# Patient Record
Sex: Male | Born: 1985 | Race: White | Hispanic: No | Smoking: Current every day smoker
Health system: Southern US, Community
[De-identification: ages and names within clinical notes are randomized; demographics above are authoritative.]

## PROBLEM LIST (undated history)

## (undated) DIAGNOSIS — G40409 Other generalized epilepsy and epileptic syndromes, not intractable, without status epilepticus: Secondary | ICD-10-CM

## (undated) DIAGNOSIS — R569 Unspecified convulsions: Secondary | ICD-10-CM

## (undated) DIAGNOSIS — J302 Other seasonal allergic rhinitis: Secondary | ICD-10-CM

## (undated) DIAGNOSIS — Z9889 Other specified postprocedural states: Secondary | ICD-10-CM

## (undated) HISTORY — PX: LAMINECTOMY: SHX219

## (undated) HISTORY — PX: SHOULDER SURGERY: SHX246

## (undated) HISTORY — PX: KNEE SURGERY: SHX244

## (undated) HISTORY — PX: ULNAR TUNNEL RELEASE: SHX820

---

## 2005-07-09 ENCOUNTER — Emergency Department: Payer: Self-pay | Admitting: Emergency Medicine

## 2007-07-12 ENCOUNTER — Ambulatory Visit: Payer: Self-pay | Admitting: Family Medicine

## 2007-12-27 ENCOUNTER — Other Ambulatory Visit: Payer: Self-pay

## 2007-12-28 ENCOUNTER — Inpatient Hospital Stay: Payer: Self-pay | Admitting: Internal Medicine

## 2008-06-19 ENCOUNTER — Inpatient Hospital Stay: Payer: Self-pay | Admitting: Psychiatry

## 2009-11-25 ENCOUNTER — Emergency Department: Payer: Self-pay | Admitting: Emergency Medicine

## 2010-01-04 ENCOUNTER — Inpatient Hospital Stay: Payer: Self-pay | Admitting: Psychiatry

## 2010-12-14 ENCOUNTER — Emergency Department: Payer: Self-pay | Admitting: *Deleted

## 2011-02-17 ENCOUNTER — Emergency Department: Payer: Self-pay | Admitting: Unknown Physician Specialty

## 2011-03-04 ENCOUNTER — Ambulatory Visit: Payer: Self-pay | Admitting: Family Medicine

## 2011-05-12 ENCOUNTER — Emergency Department: Payer: Self-pay | Admitting: Emergency Medicine

## 2012-04-13 IMAGING — CT CT HEAD WITHOUT AND WITH CONTRAST
1 of 2 series · 13 of 30 positions shown, 17 images · non-contrast
Comparison: none

REASON FOR EXAM: headache visual changes vertigo
COMMENTS:

[Series 2: soft tissue wo · axial · 0.42mm/px · z∈[+697,+817]mm · 13 of 30 slices shown, 17 images]
[im 3/30  brain]
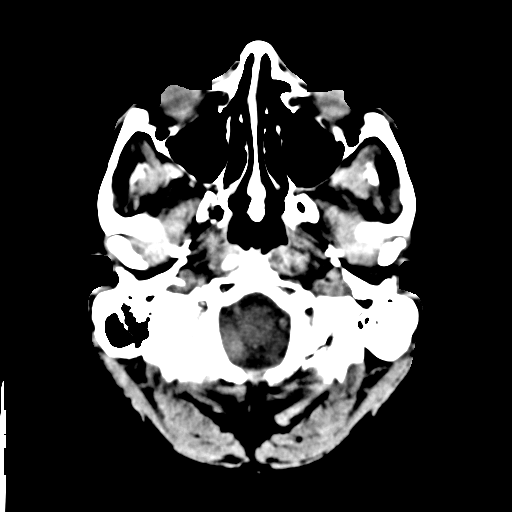
[im 3/30  bone]
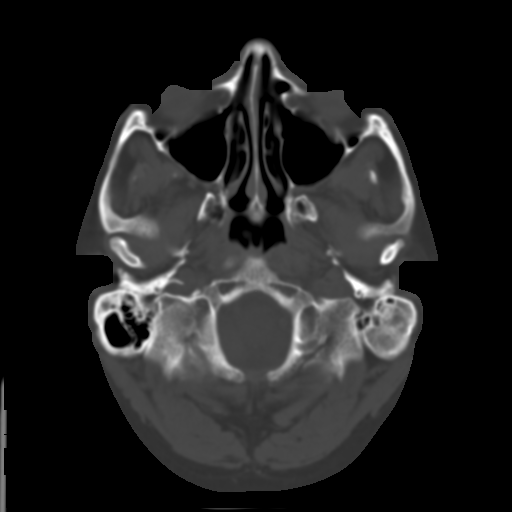
[im 5/30  brain]
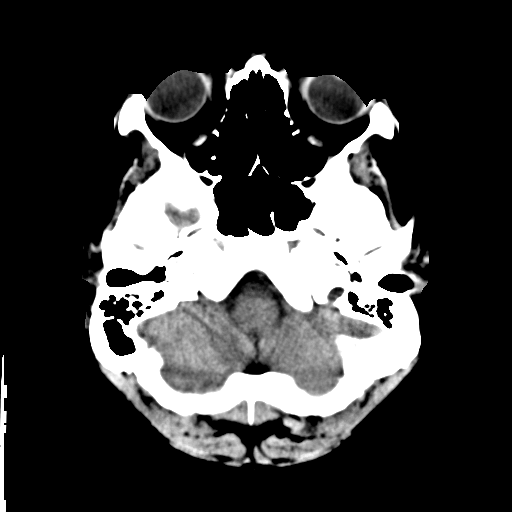
[im 7/30  brain]
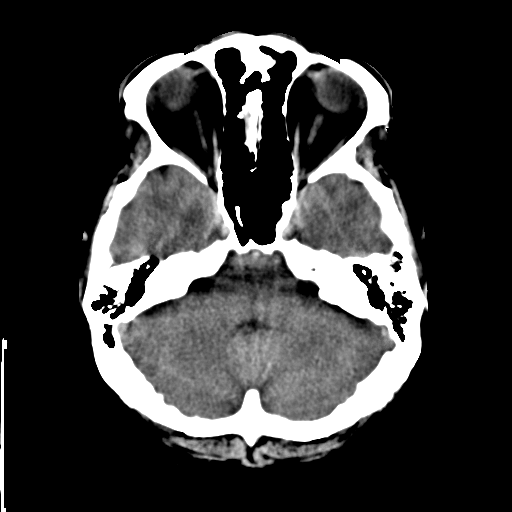
[im 9/30  brain]
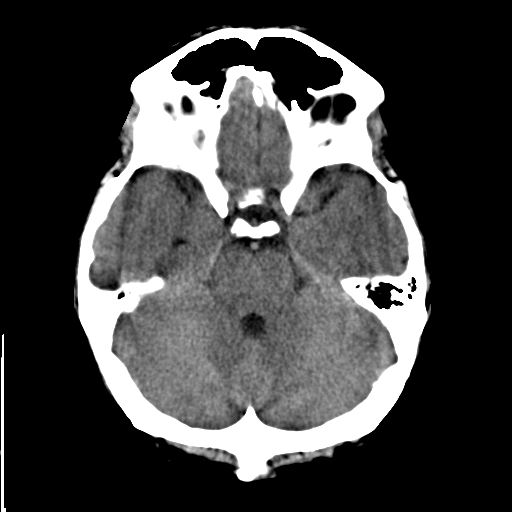
[im 11/30  brain]
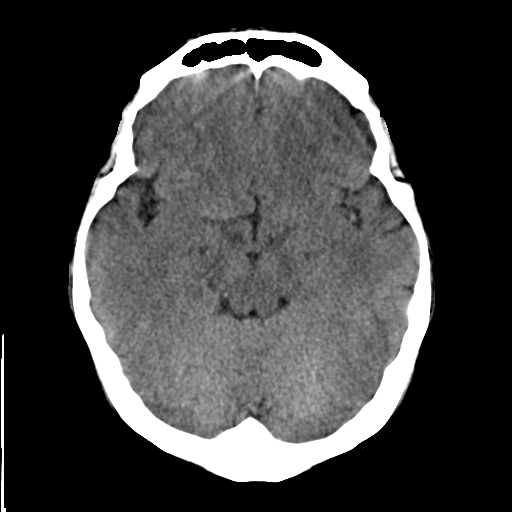
[im 11/30  bone]
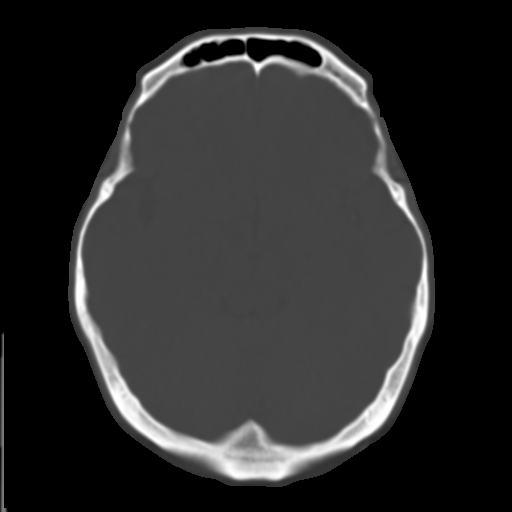
[im 13/30  brain]
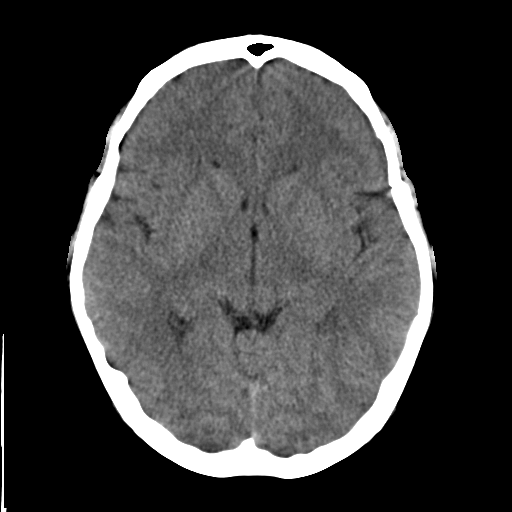
[im 15/30  brain]
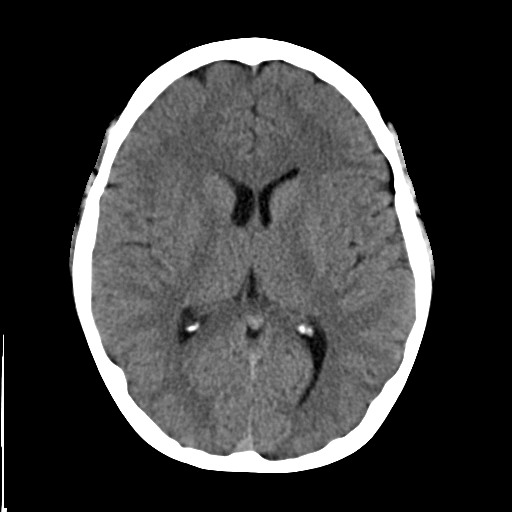
[im 17/30  brain]
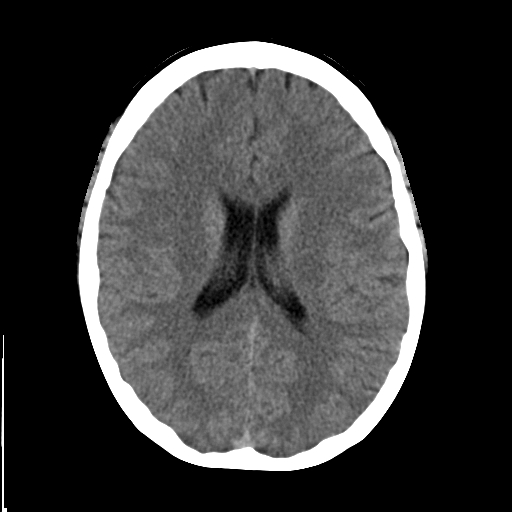
[im 19/30  brain]
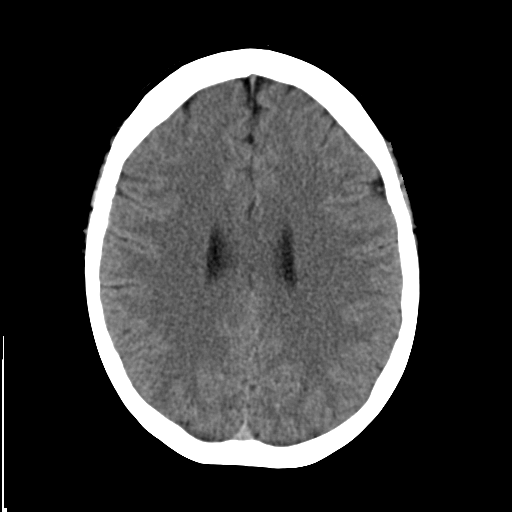
[im 19/30  bone]
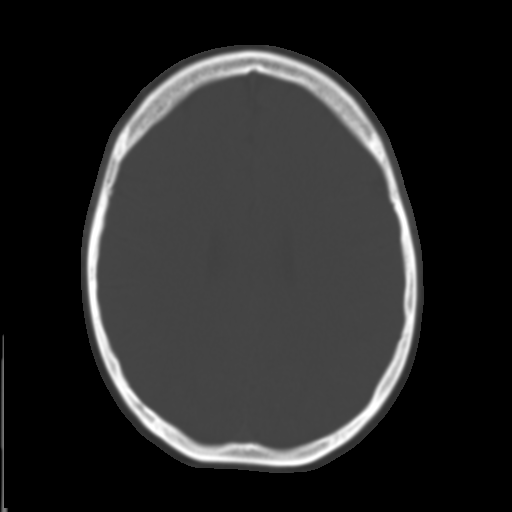
[im 21/30  brain]
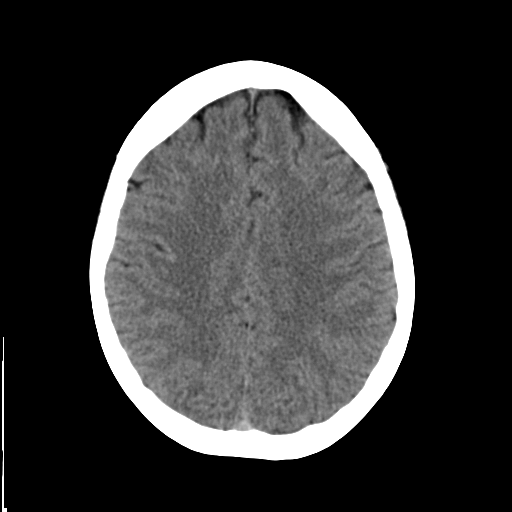
[im 23/30  brain]
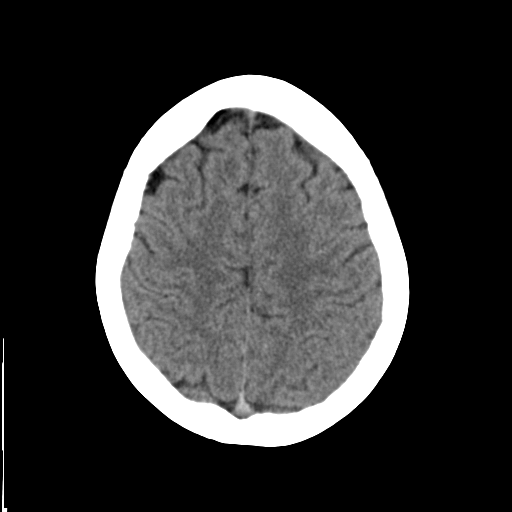
[im 25/30  brain]
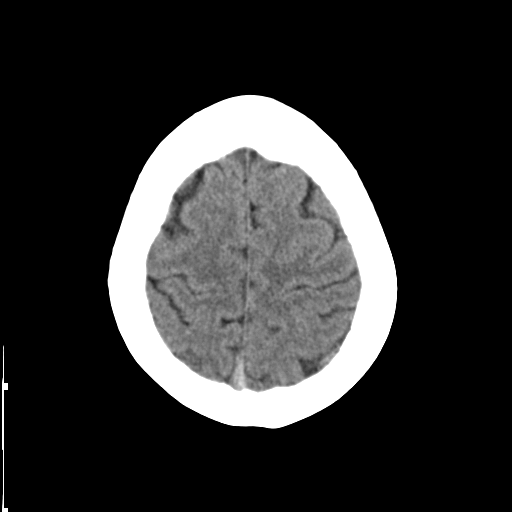
[im 27/30  brain]
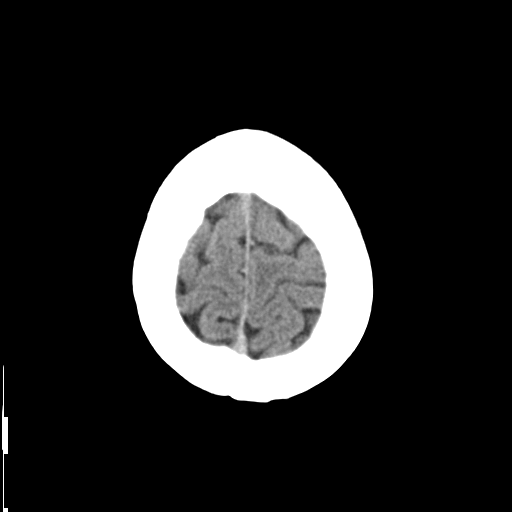
[im 27/30  bone]
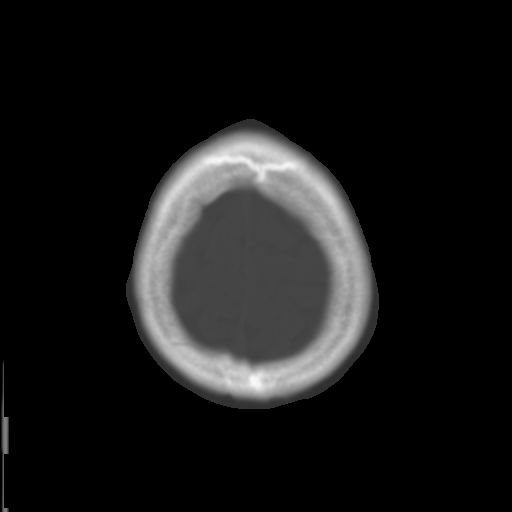

[13 of 30 positions shown; findings below may reference images not displayed]

PROCEDURE:     HOHN - RI MA GODCHA/QUEENA  - March 04, 2011 [DATE]

RESULT:     CT of the brain prior to and following intravenous
administration of 50 mL of Isovue 300 iodinated intravenous contrast is
performed and compared to the previous noncontrast exam dated 12/27/2007.

The precontrast images demonstrate the ventricles and sulci appear to be
within normal limits. There is no evidence of intracranial hemorrhage,
territorial infarct, mass, mass effect or midline shift. No hydrocephalus is
evident.

Following contrast administration there is no evidence of abnormal
enhancement to suggest a vascular malformation or discrete mass. MRI
follow-up should be considered. The sinuses and mastoids show normal
aeration. The calvarium shows no bony destruction or sclerosis and no
fracture.
IMPRESSION: No focal intracranial abnormality evident. Given the
neurologic symptoms, consideration for MRI should be given.

## 2013-06-28 ENCOUNTER — Emergency Department: Payer: Self-pay | Admitting: Emergency Medicine

## 2013-09-09 ENCOUNTER — Emergency Department: Payer: Self-pay | Admitting: Emergency Medicine

## 2013-09-26 ENCOUNTER — Emergency Department: Payer: Self-pay | Admitting: Emergency Medicine

## 2015-02-23 ENCOUNTER — Encounter: Payer: Self-pay | Admitting: Emergency Medicine

## 2015-02-23 ENCOUNTER — Ambulatory Visit
Admission: EM | Admit: 2015-02-23 | Discharge: 2015-02-23 | Disposition: A | Discharge status: Home / Self Care | Payer: Self-pay | Attending: Family Medicine | Admitting: Family Medicine

## 2015-02-23 DIAGNOSIS — J0110 Acute frontal sinusitis, unspecified: Secondary | ICD-10-CM

## 2015-02-23 DIAGNOSIS — J0100 Acute maxillary sinusitis, unspecified: Secondary | ICD-10-CM

## 2015-02-23 HISTORY — DX: Other seasonal allergic rhinitis: J30.2

## 2015-02-23 HISTORY — DX: Other generalized epilepsy and epileptic syndromes, not intractable, without status epilepticus: G40.409

## 2015-02-23 HISTORY — DX: Other specified postprocedural states: Z98.890

## 2015-02-23 HISTORY — DX: Unspecified convulsions: R56.9

## 2015-02-23 MED ORDER — BENZONATATE 100 MG PO CAPS: 100.0000 mg | ORAL_CAPSULE | Freq: Three times a day (TID) | ORAL | Status: DC | PRN

## 2015-02-23 MED ORDER — AMOXICILLIN-POT CLAVULANATE 875-125 MG PO TABS: 1.0000 | ORAL_TABLET | Freq: Two times a day (BID) | ORAL | Status: AC

## 2015-02-23 NOTE — ED Provider Notes (Signed)
[facility] Emergency Department Alieyah Spader Note  ____________________________________________  Time seen: Approximately 2:30 PM  I have reviewed the triage vital signs and the nursing notes.   HISTORY  Chief Complaint Facial Pain  HPI Geoffrey Hill is a 29 y.o. male presents for complaints of runny nose, congestion, sinus pressure and intermittent cough x 10 days. Reports started with mild runny nose and congestion and he thought it was just allergies but has worsened with sinus pressure and then continued. Denies fever. Reports continues to eat and drink well. Reports post nasal drainage at night which causes cough.   Denies chest pain, shortness of breath, nausea, vomiting, diarrhea, wheezing or other complaints.    Past Medical History  Diagnosis Date  . Seasonal allergies   . Seizures   . H/O discectomy   . Tonic clonic seizures     There are no active problems to display for this patient.   Past Surgical History  Procedure Laterality Date  . Laminectomy    . Ulnar tunnel release    . Shoulder surgery    . Knee surgery      Current Outpatient Rx  Name  Route  Sig  Dispense  Refill  . lamoTRIgine (LAMICTAL) 100 MG tablet   Oral   Take 100 mg by mouth daily. Taking 125 mg twice a day         . levETIRAcetam (KEPPRA) 750 MG tablet   Oral   Take 750 mg by mouth 2 (two) times daily.         Marland Kitchen losartan-hydrochlorothiazide (HYZAAR) 100-25 MG per tablet   Oral   Take 1 tablet by mouth daily.           Allergies Daypro  History reviewed. No pertinent family history.  Social History Social History  Substance Use Topics  . Smoking status: Current Every Day Smoker    Types: Cigarettes  . Smokeless tobacco: Never Used  . Alcohol Use: No    Review of Systems Constitutional: No fever/chills Eyes: No visual changes. ENT: positive for runny nose, congestion, sore throat and sinus pressure.  Cardiovascular: Denies chest  pain. Respiratory: Denies shortness of breath.positive for intermittent cough.  Gastrointestinal: No abdominal pain.  No nausea, no vomiting.  No diarrhea.  No constipation. Genitourinary: Negative for dysuria. Musculoskeletal: Negative for back pain. Skin: Negative for rash. Neurological: Negative for headaches, focal weakness or numbness.  10-point ROS otherwise negative.  ____________________________________________   PHYSICAL EXAM:  VITAL SIGNS: ED Triage Vitals  Enc Vitals Group     BP 02/23/15 1411 103/72 mmHg     Pulse Rate 02/23/15 1411 89     Resp 02/23/15 1411 18     Temp 02/23/15 1411 98.2 F (36.8 C)     Temp Source 02/23/15 1411 Tympanic     SpO2 02/23/15 1411 100 %     Weight 02/23/15 1411 230 lb (104.327 kg)     Height 02/23/15 1411  (1.905 m)     Head Cir --      Peak Flow --      Pain Score 02/23/15 1419 5     Pain Loc --      Pain Edu? --      Excl. in GC? --     Constitutional: Alert and oriented. Well appearing and in no acute distress. Eyes: Conjunctivae are normal. PERRL. EOMI. Head: Atraumatic.mod TTP maxillary sinuses, mild TTP frontal sinuses.   Ears: no erythema, bilateral dullness.  Nose: bilateral nares with purulent rhinorrhea with bilateral turbinate edema, nares patent.   Mouth/Throat: Mucous membranes are moist.  Oropharynx non-erythematous. Neck: No stridor.  No cervical spine tenderness to palpation. Hematological/Lymphatic/Immunilogical: No cervical lymphadenopathy. Cardiovascular: Normal rate, regular rhythm. Grossly normal heart sounds.  Good peripheral circulation. Respiratory: Normal respiratory effort.  No retractions. Lungs CTAB.Mild intermittent dry cough.  Gastrointestinal: Soft and nontender. No distention. Normal Bowel sounds.  Musculoskeletal: No lower or upper extremity tenderness nor edema.  No joint effusions. Bilateral pedal pulses equal and easily palpated.  Neurologic:  Normal speech and language. No gross focal  neurologic deficits are appreciated. No gait instability. Skin:  Skin is warm, dry and intact. No rash noted. Psychiatric: Mood and affect are normal. Speech and behavior are normal.   ____________________________________________   LABS (all labs ordered are listed, but only abnormal results are displayed)  Labs Reviewed - No data to display   INITIAL IMPRESSION / ASSESSMENT AND PLAN / ED COURSE  Pertinent labs & imaging results that were available during my care of the patient were reviewed by me and considered in my medical decision making (see chart for details).  Very well appearing. No acute distress. Presents for runny nose, congestion, sinus pressure x 10 days with intermittent cough. Will treat maxillary and frontal sinusitis with oral augmentin, prn ibuprofen, rest, fluids, supportive care, and prn tessalon perles. Discussed follow up with Primary care physician this week. Discussed follow up and return parameters including no resolution or any worsening concerns. Patient verbalized understanding and agreed to plan.   ____________________________________________   FINAL CLINICAL IMPRESSION(S) / ED DIAGNOSES  Final diagnoses:  Acute maxillary sinusitis, recurrence not specified  Acute frontal sinusitis, recurrence not specified       Renford Dills, NP 02/23/15 1456

## 2015-02-23 NOTE — Discharge Instructions (Signed)
Take medication as prescribed. Rest. Drink plenty of fluids.   Follow up with your primary care physician this week as needed. Return to Urgent care for new or worsening concerns.  Sinusitis Sinusitis is redness, soreness, and inflammation of the paranasal sinuses. Paranasal sinuses are air pockets within the bones of your face (beneath the eyes, the middle of the forehead, or above the eyes). In healthy paranasal sinuses, mucus is able to drain out, and air is able to circulate through them by way of your nose. However, when your paranasal sinuses are inflamed, mucus and air can become trapped. This can allow bacteria and other germs to grow and cause infection. Sinusitis can develop quickly and last only a short time (acute) or continue over a long period (chronic). Sinusitis that lasts for more than 12 weeks is considered chronic.  CAUSES  Causes of sinusitis include:  Allergies.  Structural abnormalities, such as displacement of the cartilage that separates your nostrils (deviated septum), which can decrease the air flow through your nose and sinuses and affect sinus drainage.  Functional abnormalities, such as when the small hairs (cilia) that line your sinuses and help remove mucus do not work properly or are not present. SIGNS AND SYMPTOMS  Symptoms of acute and chronic sinusitis are the same. The primary symptoms are pain and pressure around the affected sinuses. Other symptoms include:  Upper toothache.  Earache.  Headache.  Bad breath.  Decreased sense of smell and taste.  A cough, which worsens when you are lying flat.  Fatigue.  Fever.  Thick drainage from your nose, which often is green and may contain pus (purulent).  Swelling and warmth over the affected sinuses. DIAGNOSIS  Your health care provider will perform a physical exam. During the exam, your health care provider may:  Look in your nose for signs of abnormal growths in your nostrils (nasal  polyps).  Tap over the affected sinus to check for signs of infection.  View the inside of your sinuses (endoscopy) using an imaging device that has a light attached (endoscope). If your health care provider suspects that you have chronic sinusitis, one or more of the following tests may be recommended:  Allergy tests.  Nasal culture. A sample of mucus is taken from your nose, sent to a lab, and screened for bacteria.  Nasal cytology. A sample of mucus is taken from your nose and examined by your health care provider to determine if your sinusitis is related to an allergy. TREATMENT  Most cases of acute sinusitis are related to a viral infection and will resolve on their own within 10 days. Sometimes medicines are prescribed to help relieve symptoms (pain medicine, decongestants, nasal steroid sprays, or saline sprays).  However, for sinusitis related to a bacterial infection, your health care provider will prescribe antibiotic medicines. These are medicines that will help kill the bacteria causing the infection.  Rarely, sinusitis is caused by a fungal infection. In theses cases, your health care provider will prescribe antifungal medicine. For some cases of chronic sinusitis, surgery is needed. Generally, these are cases in which sinusitis recurs more than 3 times per year, despite other treatments. HOME CARE INSTRUCTIONS   Drink plenty of water. Water helps thin the mucus so your sinuses can drain more easily.  Use a humidifier.  Inhale steam 3 to 4 times a day (for example, sit in the bathroom with the shower running).  Apply a warm, moist washcloth to your face 3 to 4 times a day,  or as directed by your health care provider.  Use saline nasal sprays to help moisten and clean your sinuses.  Take medicines only as directed by your health care provider.  If you were prescribed either an antibiotic or antifungal medicine, finish it all even if you start to feel better. SEEK IMMEDIATE  MEDICAL CARE IF:  You have increasing pain or severe headaches.  You have nausea, vomiting, or drowsiness.  You have swelling around your face.  You have vision problems.  You have a stiff neck.  You have difficulty breathing. MAKE SURE YOU:   Understand these instructions.  Will watch your condition.  Will get help right away if you are not doing well or get worse. Document Released: 05/18/2005 Document Revised: 10/02/2013 Document Reviewed: 06/02/2011 [facility] Patient Information 2015 [place], [place]. This information is not intended to replace advice given to you by your health care provider. Make sure you discuss any questions you have with your health care provider.

## 2015-02-23 NOTE — ED Notes (Signed)
Sinus pressure, pain, cough, congestion, headache for 10 days

## 2015-03-24 ENCOUNTER — Ambulatory Visit
Admission: EM | Admit: 2015-03-24 | Discharge: 2015-03-24 | Disposition: A | Discharge status: Home / Self Care | Payer: Self-pay | Attending: Family Medicine | Admitting: Family Medicine

## 2015-03-24 DIAGNOSIS — R1032 Left lower quadrant pain: Secondary | ICD-10-CM

## 2015-03-24 LAB — COMPREHENSIVE METABOLIC PANEL
ALT: 15 U/L — ABNORMAL LOW (ref 17–63)
AST: 21 U/L (ref 15–41)
Albumin: 4.5 g/dL (ref 3.5–5.0)
Alkaline Phosphatase: 66 U/L (ref 38–126)
Anion gap: 8 (ref 5–15)
BILIRUBIN TOTAL: 0.3 mg/dL (ref 0.3–1.2)
BUN: 11 mg/dL (ref 6–20)
CO2: 28 mmol/L (ref 22–32)
Calcium: 9.2 mg/dL (ref 8.9–10.3)
Chloride: 105 mmol/L (ref 101–111)
Creatinine, Ser: 0.79 mg/dL (ref 0.61–1.24)
GFR calc Af Amer: >60 mL/min (ref >60)
Glucose, Bld: 159 mg/dL — ABNORMAL HIGH (ref 65–99)
POTASSIUM: 4.2 mmol/L (ref 3.5–5.1)
Sodium: 141 mmol/L (ref 135–145)
TOTAL PROTEIN: 7.1 g/dL (ref 6.5–8.1)

## 2015-03-24 LAB — CBC WITH DIFFERENTIAL/PLATELET
BASOS ABS: 0.1 10*3/uL (ref 0–0.1)
Basophils Relative: 1 %
EOS PCT: 3 %
Eosinophils Absolute: 0.2 10*3/uL (ref 0–0.7)
HCT: 48 % (ref 40.0–52.0)
Hemoglobin: 16 g/dL (ref 13.0–18.0)
LYMPHS PCT: 31 %
Lymphs Abs: 3.1 10*3/uL (ref 1.0–3.6)
MCH: 28.1 pg (ref 26.0–34.0)
MCHC: 33.3 g/dL (ref 32.0–36.0)
MCV: 84.4 fL (ref 80.0–100.0)
Monocytes Absolute: 0.6 10*3/uL (ref 0.2–1.0)
Monocytes Relative: 6 %
NEUTROS ABS: 5.8 10*3/uL (ref 1.4–6.5)
Neutrophils Relative %: 59 %
PLATELETS: 209 10*3/uL (ref 150–440)
RBC: 5.69 MIL/uL (ref 4.40–5.90)
RDW: 14.7 % — ABNORMAL HIGH (ref 11.5–14.5)
WBC: 9.9 10*3/uL (ref 3.8–10.6)

## 2015-03-24 NOTE — ED Notes (Signed)
States started 3 days ago with LUQ abdominal pain. Denies N/V. States normal BM today and when wipes noted bright red blood. Color good, skin warm and dry to touch

## 2015-03-24 NOTE — ED Provider Notes (Signed)
Patient presents today with symptoms of left sided abdominal pain. Patient states that he has had symptoms for the last few days. He denies any vomiting but does have some nausea. He has noticed some bloody diarrhea as well. He denies any external hemorrhoids. Does have family history of diverticulitis. His blood pressure is elevated in the office today however admits to not taking his blood pressure medication this morning. He denies any chest pain, shortness of breath, fever, severe headache.  ROS: Negative except mentioned above. Vitals as per Epic.  GENERAL: NAD HEENT: no pharyngeal erythema, no exudate RESP: CTA B CARD: RRR ABD: +BS, soft, tenderness along left lower and upper abdomen, no guarding or rebound, no flank tenderness   A/P: Left sided abdominal pain with bloody diarrhea- CBC w/diff and CMP were done, recommend patient go to the ER for further evaluation with CT, etc. family member here to take patient directly to the ER at this time. Patient is stable and able to walk out of the urgent care on his own.  Jolene ProvostKirtida Rommel [place], MD 03/24/15 [phone]

## 2015-11-22 ENCOUNTER — Encounter: Payer: Self-pay | Admitting: *Deleted

## 2015-11-22 ENCOUNTER — Ambulatory Visit (INDEPENDENT_AMBULATORY_CARE_PROVIDER_SITE_OTHER): Payer: Self-pay

## 2015-11-22 ENCOUNTER — Ambulatory Visit
Admission: EM | Admit: 2015-11-22 | Discharge: 2015-11-22 | Disposition: A | Discharge status: Home / Self Care | Payer: Self-pay | Attending: Family Medicine | Admitting: Family Medicine

## 2015-11-22 DIAGNOSIS — J40 Bronchitis, not specified as acute or chronic: Secondary | ICD-10-CM

## 2015-11-22 DIAGNOSIS — J069 Acute upper respiratory infection, unspecified: Secondary | ICD-10-CM

## 2015-11-22 MED ORDER — ALBUTEROL SULFATE HFA 108 (90 BASE) MCG/ACT IN AERS: 2.0000 | INHALATION_SPRAY | RESPIRATORY_TRACT | Status: AC | PRN

## 2015-11-22 MED ORDER — BENZONATATE 100 MG PO CAPS: 100.0000 mg | ORAL_CAPSULE | Freq: Three times a day (TID) | ORAL | Status: AC | PRN

## 2015-11-22 MED ORDER — PREDNISONE 20 MG PO TABS: 40.0000 mg | ORAL_TABLET | Freq: Every day | ORAL | Status: AC

## 2015-11-22 MED ORDER — IPRATROPIUM-ALBUTEROL 0.5-2.5 (3) MG/3ML IN SOLN
3.0000 mL | Freq: Four times a day (QID) | RESPIRATORY_TRACT | Status: DC
  Administered 2015-11-22: 3 mL via RESPIRATORY_TRACT

## 2015-11-22 NOTE — Discharge Instructions (Signed)
Take medication as prescribed. Rest. Drink plenty of fluids.   Follow up with your primary care physician this week as scheduled.   Return to Urgent care or ER for new or worsening concerns.    Upper Respiratory Infection, Adult Most upper respiratory infections (URIs) are caused by a virus. A URI affects the nose, throat, and upper air passages. The most common type of URI is often called "the common cold." HOME CARE  1. Take medicines only as told by your doctor. 2. Gargle warm saltwater or take cough drops to comfort your throat as told by your doctor. 3. Use a warm mist humidifier or inhale steam from a shower to increase air moisture. This may make it easier to breathe. 4. Drink enough fluid to keep your pee (urine) clear or pale yellow. 5. Eat soups and other clear broths. 6. Have a healthy diet. 7. Rest as needed. 8. Go back to work when your fever is gone or your doctor says it is okay. 1. You may need to stay home longer to avoid giving your URI to others. 2. You can also wear a face mask and wash your hands often to prevent spread of the virus. 9. Use your inhaler more if you have asthma. 10. Do not use any tobacco products, including cigarettes, chewing tobacco, or electronic cigarettes. If you need help quitting, ask your doctor. GET HELP IF:  You are getting worse, not better.  Your symptoms are not helped by medicine.  You have chills.  You are getting more short of breath.  You have brown or red mucus.  You have yellow or brown discharge from your nose.  You have pain in your face, especially when you bend forward.  You have a fever.  You have puffy (swollen) neck glands.  You have pain while swallowing.  You have white areas in the back of your throat. GET HELP RIGHT AWAY IF:   You have very bad or constant:  Headache.  Ear pain.  Pain in your forehead, behind your eyes, and over your cheekbones (sinus pain).  Chest pain.  You have long-lasting  (chronic) lung disease and any of the following:  Wheezing.  Long-lasting cough.  Coughing up blood.  A change in your usual mucus.  You have a stiff neck.  You have changes in your:  Vision.  Hearing.  Thinking.  Mood. MAKE SURE YOU:   Understand these instructions.  Will watch your condition.  Will get help right away if you are not doing well or get worse.   This information is not intended to replace advice given to you by your health care provider. Make sure you discuss any questions you have with your health care provider.   Document Released: 11/04/2007 Document Revised: 10/02/2014 Document Reviewed: 08/23/2013 Elsevier Interactive Patient Education 2016 [employer]  Metered Dose Inhaler (No Spacer Used) Inhaled medicines are the basis of treatment for asthma and other breathing problems. Inhaled medicine can only be effective if used properly. Good technique assures that the medicine reaches the lungs. Metered dose inhalers (MDIs) are used to deliver a variety of inhaled medicines. These include quick relief or rescue medicines (such as bronchodilators) and controller medicines (such as corticosteroids). The medicine is delivered by pushing down on a metal canister to release a set amount of spray. If you are using different kinds of inhalers, use your quick relief medicine to open the airways 10-15 minutes before using a steroid, if instructed to do so by  your health care provider. If you are unsure which inhalers to use and the order of using them, ask your health care provider, nurse, or respiratory therapist. HOW TO USE THE INHALER 11. Remove the cap from the inhaler. 12. If you are using the inhaler for the first time, you will need to prime it. Shake the inhaler for 5 seconds and release four puffs into the air, away from your face. Ask your health care provider or pharmacist if you have questions about priming your inhaler. 13. Shake the inhaler for 5  seconds before each breath in (inhalation). 14. Position the inhaler so that the top of the canister faces up. 15. Put your index finger on the top of the medicine canister. Your thumb supports the bottom of the inhaler. 16. Open your mouth. 17. Either place the inhaler between your teeth and place your lips tightly around the mouthpiece, or hold the inhaler 1-2 inches away from your open mouth. If you are unsure of which technique to use, ask your health care provider. 18. Breathe out (exhale) normally and as completely as possible. 19. Press the canister down with the index finger to release the medicine. 20. At the same time as the canister is pressed, inhale deeply and slowly until your lungs are completely filled. This should take 4-6 seconds. Keep your tongue down. 21. Hold the medicine in your lungs for 5-10 seconds (10 seconds is best). This helps the medicine get into the small airways of your lungs. 22. Breathe out slowly, through pursed lips. Whistling is an example of pursed lips. 23. Wait at least 1 minute between puffs. Continue with the above steps until you have taken the number of puffs your health care provider has ordered. Do not use the inhaler more than your health care provider directs you to. 24. Replace the cap on the inhaler. 25. Follow the directions from your health care provider or the inhaler insert for cleaning the inhaler. If you are using a steroid inhaler, after your last puff, rinse your mouth with water, gargle, and spit out the water. Do not swallow the water. AVOID:  Inhaling before or after starting the spray of medicine. It takes practice to coordinate your breathing with triggering the spray.  Inhaling through the nose (rather than the mouth) when triggering the spray. HOW TO DETERMINE IF YOUR INHALER IS FULL OR NEARLY EMPTY You cannot know when an inhaler is empty by shaking it. Some inhalers are now being made with dose counters. Ask your health care  provider for a prescription that has a dose counter if you feel you need that extra help. If your inhaler does not have a counter, ask your health care provider to help you determine the date you need to refill your inhaler. Write the refill date on a calendar or your inhaler canister. Refill your inhaler 7-10 days before it runs out. Be sure to keep an adequate supply of medicine. This includes making sure it has not expired, and making sure you have a spare inhaler. SEEK MEDICAL CARE IF:  Symptoms are only partially relieved with your inhaler.  You are having trouble using your inhaler.  You experience an increase in phlegm. SEEK IMMEDIATE MEDICAL CARE IF:  You feel little or no relief with your inhalers. You are still wheezing and feeling shortness of breath, tightness in your chest, or both.  You have dizziness, headaches, or a fast heart rate.  You have chills, fever, or night sweats.  There is  a noticeable increase in phlegm production, or there is blood in the phlegm. MAKE SURE YOU:  Understand these instructions.  Will watch your condition.  Will get help right away if you are not doing well or get worse.   This information is not intended to replace advice given to you by your health care provider. Make sure you discuss any questions you have with your health care provider.   Document Released: 03/15/2007 Document Revised: 06/08/2014 Document Reviewed: 11/03/2012 Elsevier Interactive Patient Education [employer]

## 2015-11-22 NOTE — ED Notes (Signed)
Non- productive cough x2 days. Also chest soreness r/t coughing. Denies fevers and other symptoms.

## 2015-11-22 NOTE — ED Provider Notes (Signed)
[facility]  ____________________________________________  Time seen: Approximately 11:56 AM  I have reviewed the triage vital signs and the nursing notes.   HISTORY  Chief Complaint Cough and Pleurisy  HPI Geoffrey Hill is a 30 y.o. male presents for the complaints of 2 days of cough and chest congestion. Patient reports some runny nose and nasal congestion as well. Patient states that the cough is biggest complaint. Patient states the cough is primarily a dry hacking cough but occasionally productive at night or first thing in the morning. Patient reports in the last 2 days he feels that he has been coughing so much that he is now sore along his ribs. Reports continues to eat and drink well. Denies known sick contacts. Denies fevers. Reports unresolved with over-the-counter cough and congestion medication. Denies any pain at this time. Patient states that his cough tends to be in clusters. Patient states occasionally hear himself wheeze with cough. Denies other wheezing. Denies sore throat.  Denies chest pain, shortness of breath, chest pain with deep breath, abdominal pain, dysuria, neck pain or back pain, extremity pain or extremity swelling. 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 Patient states that his last seizure was consistent with his chronic history of seizures. Denies any injury or head trauma from his seizure.  Patient reports has an appointment with his primary care physician this coming week.    Past Medical History  Diagnosis Date  . Seasonal allergies   . Seizures (HCC)   . H/O discectomy   . Tonic clonic seizures (HCC)    Last seizure: 2 weeks ago. Denies injury from and states normal for him.   There are no active problems to display for this patient.   Past Surgical History  Procedure Laterality Date  . Laminectomy    . Ulnar tunnel release    . Shoulder surgery    . Knee surgery       Current Outpatient Rx  Name  Route  Sig  Dispense  Refill  . lamoTRIgine (LAMICTAL) 100 MG tablet   Oral   Take 100 mg by mouth daily. Taking 125 mg twice a day         . levETIRAcetam (KEPPRA) 750 MG tablet   Oral   Take 750 mg by mouth 2 (two) times daily.         Marland Kitchen. losartan-hydrochlorothiazide (HYZAAR) 100-25 MG per tablet   Oral   Take 1 tablet by mouth daily.                                 Allergies Daypro  History reviewed. No pertinent family history.  Social History Social History  Substance Use Topics  . Smoking status: Current Every Day Smoker -- 1.00 packs/day    Types: Cigarettes  . Smokeless tobacco: Never Used  . Alcohol Use: No    Review of Systems Constitutional: No fever/chills Eyes: No visual changes. ENT: No sore throat.As above. Cardiovascular: Denies chest pain. Respiratory: Denies shortness of breath. Gastrointestinal: No abdominal pain.  No nausea, no vomiting.  No diarrhea.  No constipation. Genitourinary: Negative for dysuria. Musculoskeletal: Negative for back pain. Skin: Negative for rash. Neurological: Negative for headaches, focal weakness or numbness.  10-point ROS otherwise negative.  ____________________________________________   PHYSICAL EXAM:  VITAL SIGNS: ED Triage Vitals  Enc Vitals Group     BP 11/22/15  1112 120/82 mmHg     Pulse Rate 11/22/15 1112 109     Resp 11/22/15 1112 20     Temp 11/22/15 1112 98 F (36.7 C)     Temp Source 11/22/15 1112 Oral     SpO2 11/22/15 1112 99 %     Weight 11/22/15 1112 235 lb (106.595 kg)     Height 11/22/15 1112  (1.905 m)     Head Cir --      Peak Flow --      Pain Score 11/22/15 1119 6     Pain Loc --      Pain Edu? --      Excl. in GC? --    Constitutional: Alert and oriented. Well appearing and in no acute distress. Eyes: Conjunctivae are normal. PERRL. EOMI. Head: Atraumatic. No sinus tenderness to palpation. No swelling. No erythema.  Ears: no  erythema, normal TMs bilaterally.   Nose:Nasal congestion with clear rhinorrhea  Mouth/Throat: Mucous membranes are moist. No pharyngeal erythema. No tonsillar swelling or exudate.  Neck: No stridor.  No cervical spine tenderness to palpation. Hematological/Lymphatic/Immunilogical: No cervical lymphadenopathy. Cardiovascular: Normal rate, regular rhythm. Grossly normal heart sounds.  Good peripheral circulation. Respiratory: Normal respiratory effort.  No retractions. Speaks in complete sentences. No wheezes or rales. Left lower mild rhonchi. Dry intermittent cough noted in room with mild bronchospasm wheeze with cough. Good air movement.  Gastrointestinal: Soft and nontender. Normal Bowel sounds. No CVA tenderness. Musculoskeletal: No lower or upper extremity tenderness nor edema. No cervical, thoracic or lumbar tenderness to palpation. No calf tenderness bilaterally. Bilateral pedal pulses equal and easily palpated.  Neurologic:  Normal speech and language. No gross focal neurologic deficits are appreciated. No gait instability. Skin:  Skin is warm, dry and intact. No rash noted. Psychiatric: Mood and affect are normal. Speech and behavior are normal. ____________________________________________   LABS (all labs ordered are listed, but only abnormal results are displayed)  Labs Reviewed - No data to display  RADIOLOGY  Dg Chest 2 View  11/22/2015  CLINICAL DATA:  Pt has had prod cough and congestion with upper chest tightness x 2-3 days. Pain in post LLL region when coughing. LL rhonchi per ordering physician. Smoker. Hx bronchitis EXAM: CHEST - 2 VIEW COMPARISON:  05/12/2011 FINDINGS: Linear scarring or subsegmental atelectasis in the lingula as before. Right lung clear. Heart size and mediastinal contours are within normal limits. No effusion. Visualized bones unremarkable. IMPRESSION: No acute cardiopulmonary disease. Electronically Signed   By: Corlis Leak M.D.   On: 11/22/2015 12:41    ____________________________________________   INITIAL IMPRESSION / ASSESSMENT AND PLAN / ED COURSE  Pertinent labs & imaging results that were available during my care of the patient were reviewed by me and considered in my medical decision making (see chart for details).  Well-appearing patient. No acute distress. Presents for complaints of 2 days of cough and chest congestion. Also with rhinorrhea. Denies fevers. Denies chest pain or shortness of breath. Reports continues to eat and drink well. Suspect bronchitis and viral upper respiratory infection, however with left lower rhonchi Discussed with patient evaluation and treatment options. Discussed treating symptomatically and supportively and having close PCP follow-up versus evaluating by a chest x-ray at this time. Patient opted to proceed with chest x-ray at this time. Albuterol ipratropium neb 1.  Post albuterol neb, patient reports feeling much better, lungs reassess, wheezes resolved no longer present with cough. Continues with intermittent dry cough per patient but states  improved. Denies chest pain, chest pain with deep breath or shortness of breath. Patient states overall he feels better. Chest x-ray reviewed, no acute cardiopulmonary disease. Will treat with oral 3 day course of prednisone, when necessary Pro Air inhaler and when necessary [employer]les. Encouraged keeping appointments with PCP next week for follow-up. Discussed indication, risks and benefits of medications with patient. Work note given for today. Discussed with patient as with the length of time of symptoms with negative chest x-ray, will defer antibiotic use at this time the patient agrees to this.   Discussed follow up with Primary care physician this week. Discussed follow up and return parameters including chest pain, shortness of breath, fevers, no resolution or any worsening concerns. Patient verbalized understanding and agreed to plan.    ____________________________________________   FINAL CLINICAL IMPRESSION(S) / ED DIAGNOSES  Final diagnoses:  Bronchitis  Upper respiratory infection     Discharge Medication List as of 11/22/2015 12:53 PM    START taking these medications   Details  albuterol (PROVENTIL HFA;VENTOLIN HFA) 108 (90 Base) MCG/ACT inhaler Inhale 2 puffs into the lungs every 4 (four) hours as needed., Starting 11/22/2015, Until Discontinued, Normal    predniSONE (DELTASONE) 20 MG tablet Take 2 tablets (40 mg total) by mouth daily., Starting 11/22/2015, Until Discontinued, Normal      tessalon perles  Note: This dictation was prepared with Dragon dictation along with smaller phrase technology. Any transcriptional errors that result from this process are unintentional.       Renford DillsLindsey Ferron Ishmael, NP 11/22/15 1428

## 2017-01-01 IMAGING — CR DG CHEST 2V
2 series · 3 of 3 positions shown · non-contrast
Comparison: 05/12/2011

CLINICAL DATA: Pt has had [place] cough and congestion with upper
chest tightness x 2-3 days. Pain in post LLL region when coughing.
LL rhonchi per ordering physician. Smoker. Hx bronchitis

EXAM:
CHEST - 2 VIEW

[Series 1: chest pa · 0.14mm/px · 2 of 2 slices shown]
[im 1/2]
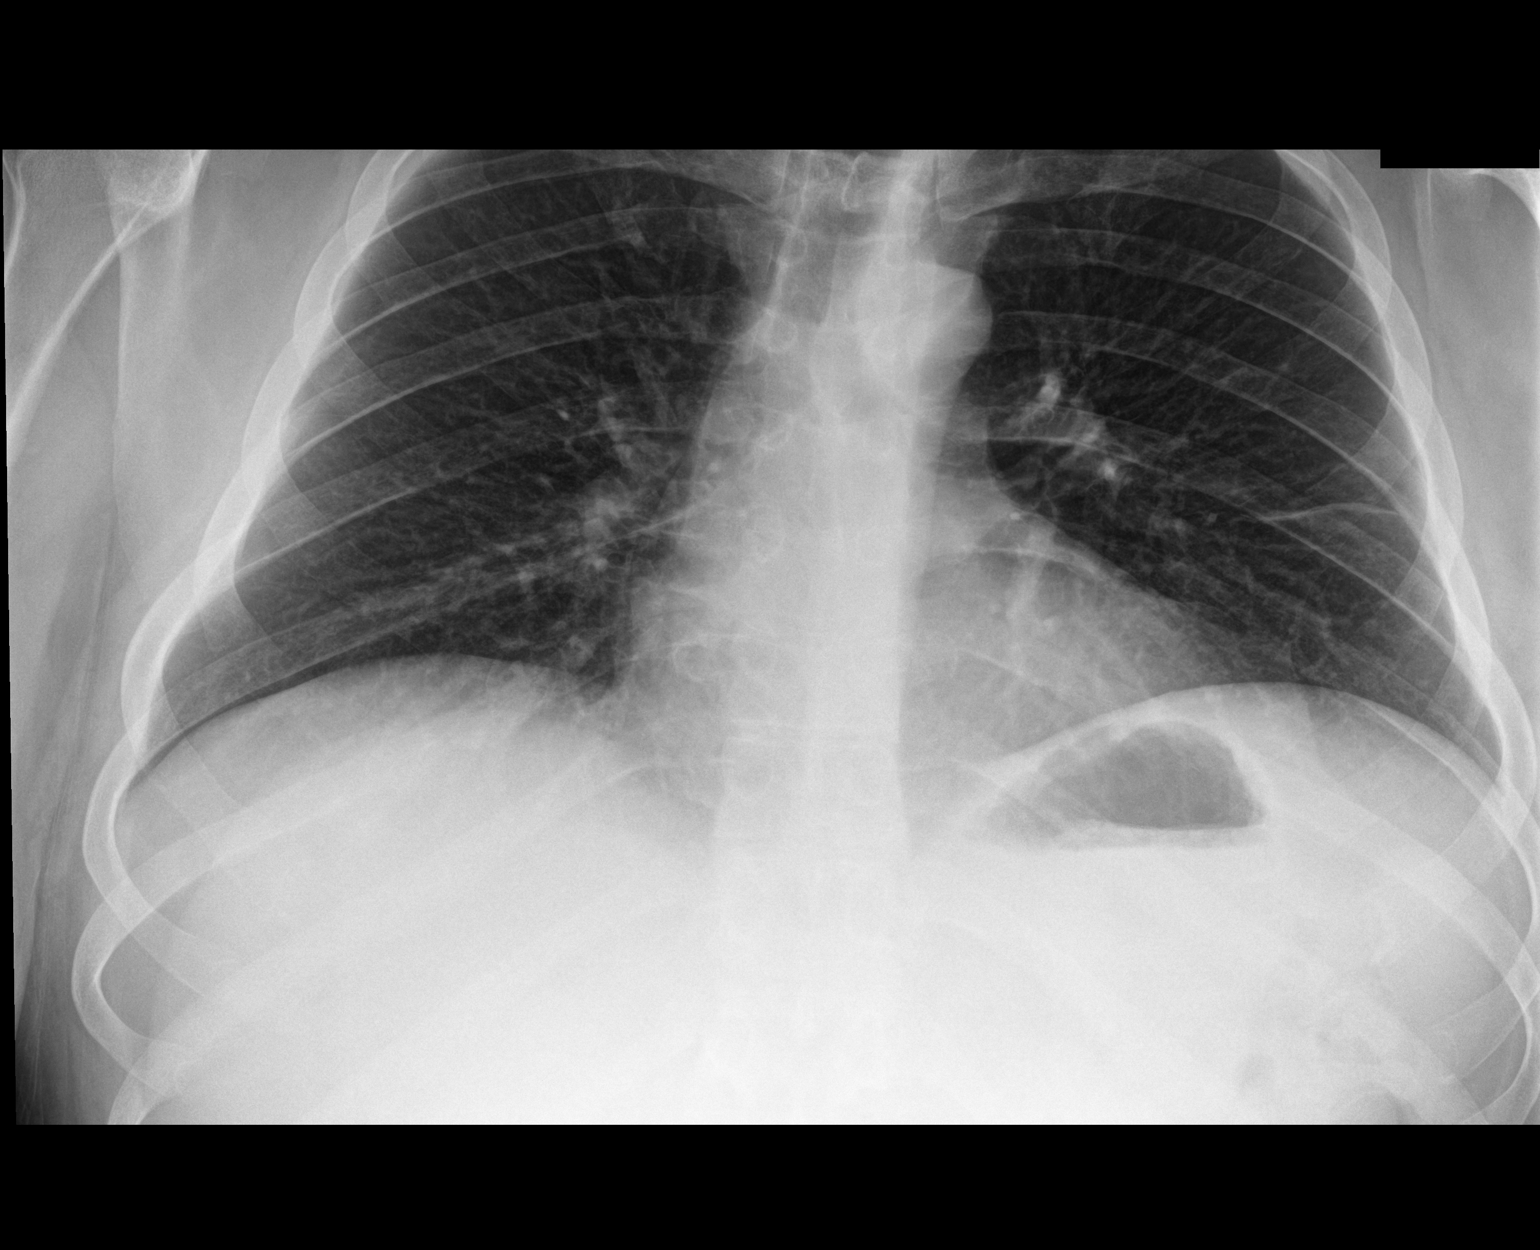
[im 2/2]
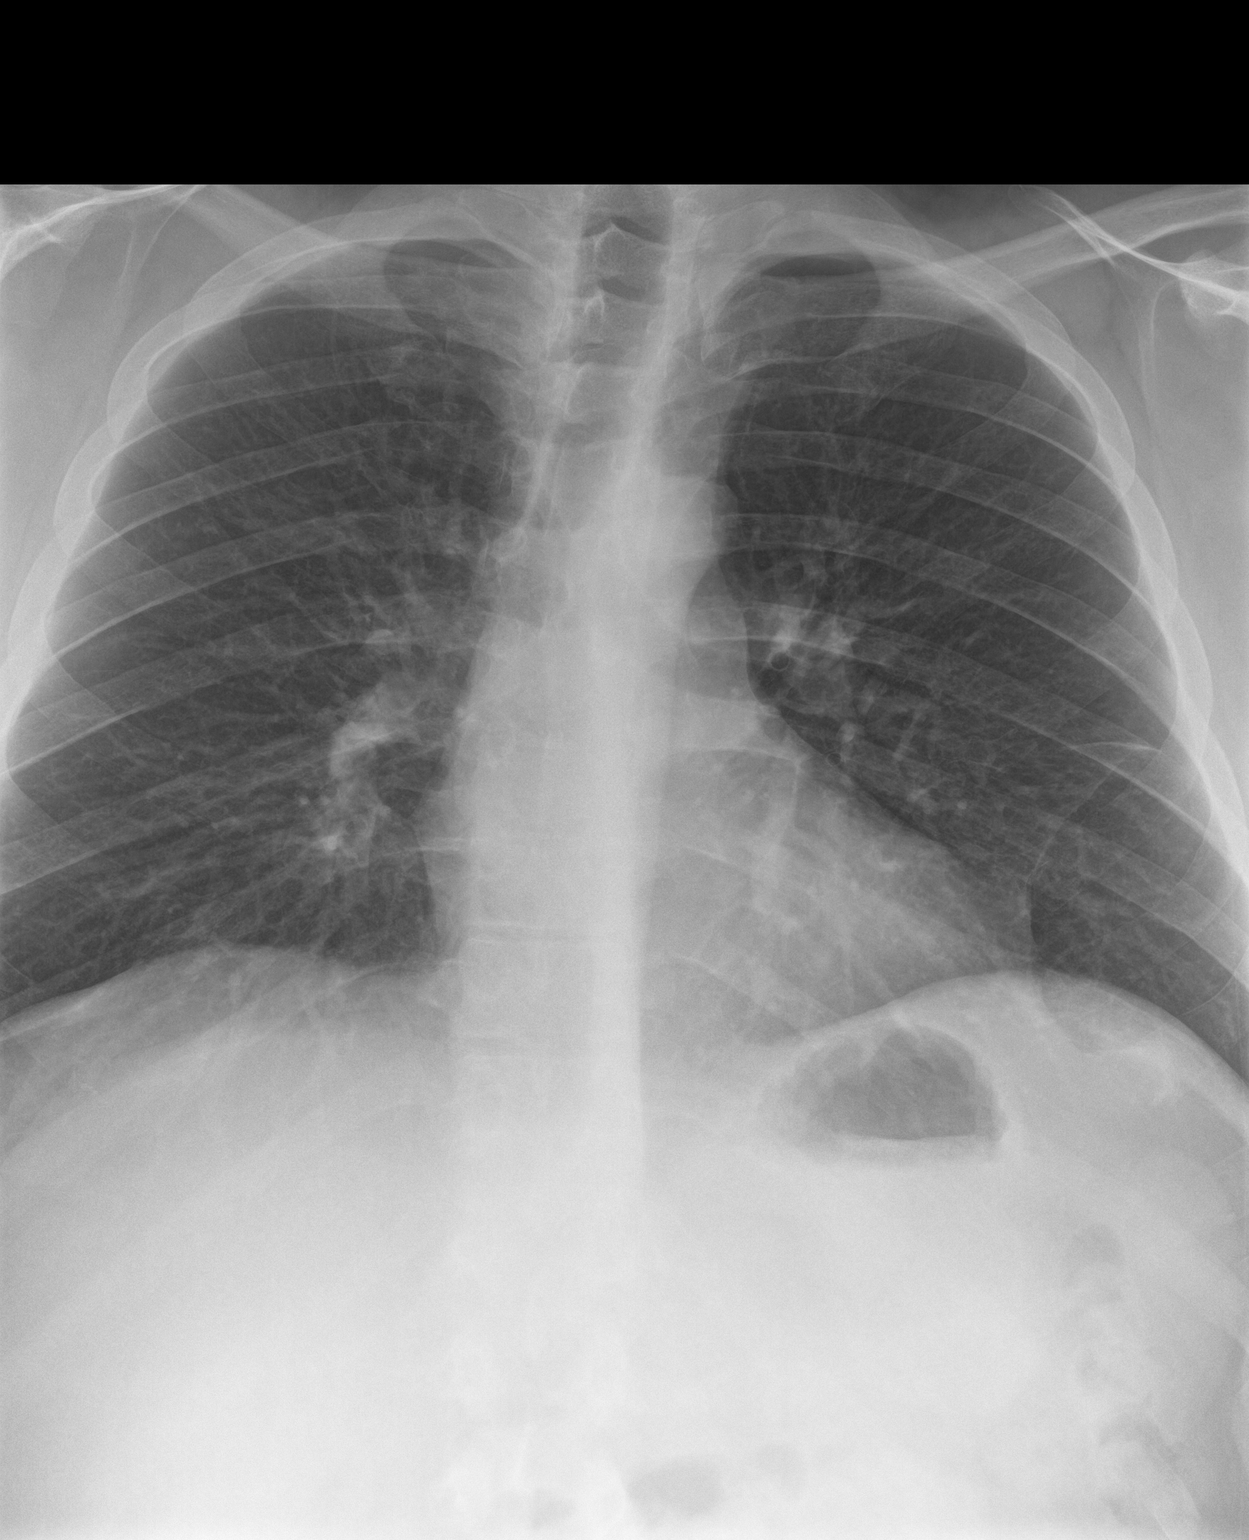

[chest lat]
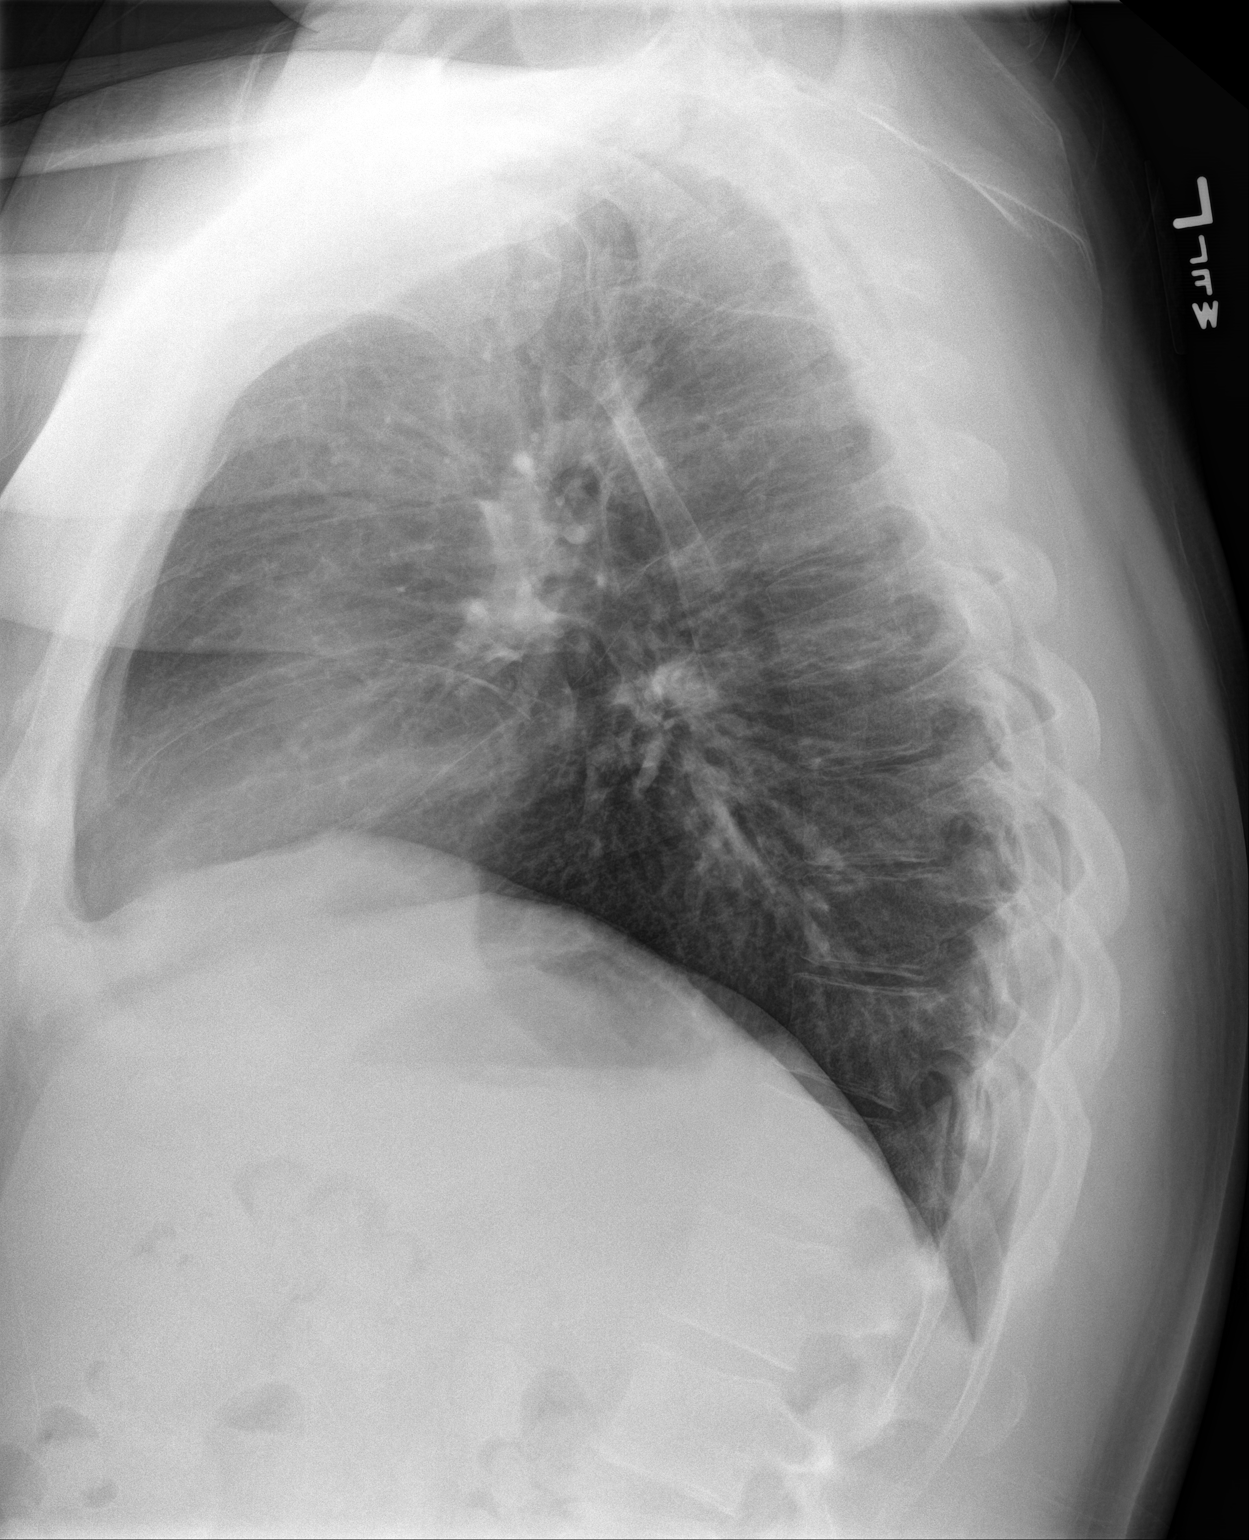

[3 of 3 positions shown; findings below may reference images not displayed]

FINDINGS: Linear scarring or subsegmental atelectasis in the lingula as
before. Right lung clear.

Heart size and mediastinal contours are within normal limits.

No effusion.

Visualized bones unremarkable.
IMPRESSION: No acute cardiopulmonary disease.

## 2018-08-31 DEATH — deceased

## 2019-08-31 DEATH — deceased
